# Patient Record
Sex: Male | Born: 1965 | Race: White | Hispanic: No | Marital: Single | Smoking: Current some day smoker
Health system: Southern US, Community
[De-identification: ages and names within clinical notes are randomized; demographics above are authoritative.]

---

## 1999-07-09 ENCOUNTER — Ambulatory Visit (HOSPITAL_COMMUNITY): Admission: RE | Admit: 1999-07-09 | Discharge: 1999-07-09 | Payer: Self-pay | Admitting: Gastroenterology

## 2003-07-08 ENCOUNTER — Emergency Department (HOSPITAL_COMMUNITY): Admission: EM | Admit: 2003-07-08 | Discharge: 2003-07-08 | Payer: Self-pay | Admitting: Emergency Medicine

## 2003-10-14 ENCOUNTER — Inpatient Hospital Stay (HOSPITAL_COMMUNITY): Admission: EM | Admit: 2003-10-14 | Discharge: 2003-10-16 | Payer: Self-pay | Admitting: Psychiatry

## 2005-04-24 ENCOUNTER — Emergency Department (HOSPITAL_COMMUNITY): Admission: EM | Admit: 2005-04-24 | Discharge: 2005-04-24 | Payer: Self-pay | Admitting: Emergency Medicine

## 2005-09-28 ENCOUNTER — Emergency Department (HOSPITAL_COMMUNITY): Admission: EM | Admit: 2005-09-28 | Discharge: 2005-09-28 | Payer: Self-pay | Admitting: Emergency Medicine

## 2005-10-16 ENCOUNTER — Emergency Department (HOSPITAL_COMMUNITY): Admission: EM | Admit: 2005-10-16 | Discharge: 2005-10-16 | Payer: Self-pay | Admitting: Emergency Medicine

## 2005-11-11 ENCOUNTER — Ambulatory Visit: Payer: Self-pay | Admitting: Psychiatry

## 2005-11-11 ENCOUNTER — Encounter: Payer: Self-pay | Admitting: *Deleted

## 2005-11-11 ENCOUNTER — Inpatient Hospital Stay (HOSPITAL_COMMUNITY): Admission: EM | Admit: 2005-11-11 | Discharge: 2005-11-15 | Payer: Self-pay | Admitting: Psychiatry

## 2005-11-22 ENCOUNTER — Emergency Department (HOSPITAL_COMMUNITY): Admission: EM | Admit: 2005-11-22 | Discharge: 2005-11-22 | Payer: Self-pay | Admitting: *Deleted

## 2007-05-15 ENCOUNTER — Emergency Department (HOSPITAL_COMMUNITY): Admission: EM | Admit: 2007-05-15 | Discharge: 2007-05-15 | Payer: Self-pay | Admitting: Emergency Medicine

## 2008-06-12 ENCOUNTER — Emergency Department (HOSPITAL_COMMUNITY): Admission: EM | Admit: 2008-06-12 | Discharge: 2008-06-12 | Payer: Self-pay | Admitting: Emergency Medicine

## 2010-08-16 NOTE — Procedures (Signed)
. [facility]  Patient:    Dennis Rojas, Dennis Rojas                             MRN: [id number] Proc. Date: 07/09/99 Adm. Date:  [id number] Attending:  Starr Sinclair CC:         Corwin Levins, M.D.                           Procedure Report  PROCEDURE:  Esophagogastroduodenoscopy.  ENDOSCOPIST:  Venita Lick. Pleas Koch., M.D.  REFERRING PHYSICIAN:  Corwin Levins, M.D.  INDICATIONS:  A 45 year old white male with a long history of GERD and a hiatal hernia.  He has epigastric pain as well.  His reflux symptoms have not responded well to a trial of Prilosec 20 mg b.i.d.  In addition, he frequently takes [facility] [employer] and [employer].  I have asked him to discontinue these medications, nd to stick closely to antireflux measures.  He has not been able to follow these recommendations very well, and has ongoing symptoms.  PHYSICAL EXAMINATION:  Chest clear to auscultation.  Cardiac regular rate and rhythm without murmurs.  Neurologic alert and oriented x 3.  ANESTHESIA:  Fentanyl 75 mcg IV, Versed 7.5 mg IV, and pharyngeal Cetacaine spray.  MONITORING:  Automated blood pressure monitor, pulse oximeter, and cardiac monitoring.  Low flow oxygen was given by nasal cannula throughout the procedure. The procedure was well-tolerated with no immediate complications.  DESCRIPTION OF PROCEDURE:  After the nature of the procedure was discussed with the patient including the discussion of its risks, benefits, and alternatives, he consented to proceed.  He was then comfortably sedated in the left lateral decubitus position.  The Olympus video endoscope was inserted into the posterior pharynx and esophagus intubated under direct visualization.  The upper and middle third of the esophagus had a normal endoscopic appearance.  In the distal esophagus, there was three to four linear erosions, the longest measuring approximately 1 cm.  Video photographs were obtained.   The Z-line measured at 40 cm from the incisors.  A small sliding hiatal hernia was noted in the gastric cardia on forward and retroflexed view.  Examination of the gastric body was normal. n retroflexed view of the angularis, lesser curvature, and fundus were normal. The gastric antrum, gastric pylorus, duodenal bulb, and descending duodenum were all unremarkable.  Video photographs were obtained.  The stomach was decompressed and the endoscope was withdrawn from the patient.  IMPRESSION: 1. Distal erosive esophagitis. 2. Small sliding hiatal hernia.  RECOMMENDATIONS: 1. Strict antireflux measures.  I again stressed this to the patient.  He needs to    minimize or discontinue alcohol usage.  He needs to discontinue cigarette usage    and follow the dietary recommendations closely. 2. Begin Nexium 40 mg p.o. q.a.m. 3. Return office visit with Corwin Levins, M.D. in eight weeks.  I will see back on    referral. DD:  07/09/99 TD:  07/10/99 Job: [id number]

## 2010-08-16 NOTE — Discharge Summary (Signed)
Dennis Rojas, AMORIN NO.:  [id number]   MEDICAL RECORD NO.:  [id number]                   PATIENT TYPE:  IPS   LOCATION:  0508                                 FACILITY:  [facility]   PHYSICIAN:  Geoffery Lyons, M.D.                   DATE OF BIRTH:  1966-03-05   DATE OF ADMISSION:  10/14/2003  DATE OF DISCHARGE:  10/16/2003                                 DISCHARGE SUMMARY   CHIEF COMPLAINT AND PRESENT ILLNESS:  This was the first admission to [facility] for this 45 year old single white male involuntarily  committed.  Presented to the [facility] drunk, had a rope on his back pack  and complained of suicidal tendencies.  Admits prior attempts to hang  himself.  Alcohol for 20+ years.  Alcohol level in the emergency room was  216.  Has been on a 4-5 day binge.  Frequently uses in a binge pattern.  Depressed and lonely.  Mother died one year prior to this admission of  melanoma.  No family.  Homeless.  Thrown out of [employer].  Positive  auditory hallucinations when intoxicated.  Belligerent with the police when  intoxicated.   PAST PSYCHIATRIC HISTORY:  First time inpatient.  Endorsed depression and  polysubstance abuse.   ALCOHOL/DRUG HISTORY:  Longest time sober not clear.  Ongoing use of  alcohol.  Last drink one-fifth of tequila the day before.   PAST MEDICAL HISTORY:  Bleeding ulcers.   MEDICATIONS:  Was given Xanax and Zantac.  Cannot take it.  Cannot afford  it.   PHYSICAL EXAMINATION:  Mild abdominal tenderness.   LABORATORY DATA:  SGOT 210, SGPT 65.  Total bilirubin 0.7.  Albumin 2.7.  TSH 0.473.   MENTAL STATUS EXAM:  Fully alert, passively cooperative male.  Polite.  Cynical at times.  Speech not as spontaneous.  Some psychomotor retardation.  Mood depressed.  Affect depressed.  Thought processes logical and coherent.  No evidence of psychosis.  No delusional ideas.  Passive suicidal ideation  with no plan.  No  homicidal ideation.  Feeling worthless and useless.  Cognition well-preserved.  No hallucinations.   ADMISSION DIAGNOSES:   AXIS I:  1. Alcohol dependence.  2. Rule out polysubstance abuse.  3. Depressive disorder not otherwise specified.   AXIS II:  No diagnosis.   AXIS III:  Ulcers.   AXIS IV:  Moderate.   AXIS V:  Global Assessment of Functioning upon 26; highest Global Assessment  of Functioning in the last year 55-60.   HOSPITAL COURSE:  He was admitted and started in individual and group  psychotherapy.  He was detoxified with Librium.  Was given Protonix 40 mg  twice a day.  He endorsed recurrent use of alcohol but has been working, has  been staying in the shelter.  Endorsed chronic depression, never treated for  depression.  Claimed sad, also depression and affect when he relapses but  wanting to pursue treatment.  Detox went uneventfully.  He was given options  for places to stay.  He contacted [facility] and he was going to be  interviewed.  On October 16, 2003, he felt he was ready to go.  He was going to  be interviewed in the halfway house and knows it was a high chance that they  were going to accept him.  There was no evidence of acute withdrawal.  Had  tolerated the Wellbutrin well.  Endorsed no suicidal or homicidal ideation.  No evidence of hallucinations.  He was motivated to pursue further  outpatient treatment and work on long-term abstinence for what he was  discharged to outpatient follow-up.   DISCHARGE DIAGNOSES:   AXIS I:  1. Alcohol dependence.  2. Depressive disorder not otherwise specified.   AXIS II:  No diagnosis.   AXIS III:  Ulcers.   AXIS IV:  Moderate.   AXIS V:  Global Assessment of Functioning upon discharge 50-55.   DISCHARGE MEDICATIONS:  1. Librium taper 25 mg, 1 twice a day on October 16, 2003 and 1 in the morning     on October 17, 2003; then discontinue.  2. Protonix 40 mg twice a day.  3. Wellbutrin XL 150 mg daily with plan to  increase to 300 mg.   FOLLOW UP:  [facility], Dr. Lang Snow.                                               Geoffery Lyons, M.D.    IL/MEDQ  D:  11/08/2003  T:  11/08/2003  Job:  [id number]

## 2010-08-16 NOTE — Discharge Summary (Signed)
[name], [name] NO.:  [id number]   MEDICAL RECORD NO.:  [id number]          PATIENT TYPE:  IPS   LOCATION:  0500                          FACILITY:  [facility]   PHYSICIAN:  Geoffery Lyons, M.D.      DATE OF BIRTH:  03-22-1966   DATE OF ADMISSION:  11/11/2005  DATE OF DISCHARGE:  11/15/2005                                 DISCHARGE SUMMARY   CHIEF COMPLAINT AND PRESENT ILLNESS:  This was the first admission to [facility] for this 45 year old white male voluntarily admitted.  Presented to the emergency department, experiencing one day of history of  formication and visual hallucinations.  Had been drinking daily for a week,  6-12 beers preceded by binging on Xanax three weeks prior to this admission.  Stressed by his homelessness and joblessness.  Has had suicidal thoughts  secondary to depression.   PAST PSYCHIATRIC HISTORY:  First time at [facility].  Previous  admission to [facility]9 days three months ago after binging on Xanax 2  mg with alcohol.   ALCOHOL/DRUG HISTORY:  As already stated, persistent use of alcohol,  escalating use.  Also abuse of benzodiazepines.   MEDICAL HISTORY:  Headaches.   MEDICATIONS:  No active medications.   PHYSICAL EXAMINATION:  Performed and failed to show any acute findings.   LABORATORY DATA:  Not available in the chart.   MENTAL STATUS EXAM:  Fully alert, cooperative male with good eye contact.  Appropriately groomed and dressed.  Sense of humor.  Candid about his  substance use.  Speech normal in rate, tempo and production.  Mood anxious.  Affect anxiety.  Thought processes logical, coherent and relevant.  No  active delusions.  No active suicidal or homicidal ideation.  No  hallucinations.  Cognition was well-preserved.   ADMISSION DIAGNOSES:  AXIS I:  Alcohol dependence.  Benzodiazepine abuse.  Rule out substance-induced depression versus depressive disorder not  otherwise specified.  AXIS II:  No diagnosis.  AXIS III:  Leukocytosis.  AXIS IV:  Moderate.  AXIS V:  GAF upon admission 30; highest GAF in the last year 70.   HOSPITAL COURSE:  He was admitted.  He was started in individual and group  psychotherapy.  He was detoxified with Librium.  He was given trazodone for  sleep.  He was eventually started on Wellbutrin.  He was able to start  opening up.  Endorsed a long history of events that had influenced him  getting to this point.  He was staying in a tent in the woods.  Homeless,  jobless, a sense of hopelessness, helplessness, feeling that things were not  going to get any better.  As we detoxed, we continued to work on his mood,  coping skills, relapse prevention.  Endorsed a long history of no energy, no  motivation, feeling overwhelmed.  He was willing to try Wellbutrin.  In  terms of placement, he was going to look at the possibility of halfway  house.  Sleep was an issue but we worked with trazodone.  Endorsed he was  committed to abstinence.  Continued to work in individual and group therapy.  By November 15, 2005, he was in full contact with reality.  There were no  active suicidal or homicidal ideation.  No hallucinations.  No delusions.  Endorsed no ideas of wanting to hurt himself.  Ready to commit to long-term  sobriety.  He was going to go into an [employer].   DISCHARGE DIAGNOSES:  AXIS I:  Alcohol dependence.  Benzodiazepine abuse.  Depressive disorder not otherwise specified.  AXIS II:  No diagnosis.  AXIS III:  No diagnosis.  AXIS IV:  Moderate.  AXIS V:  GAF upon discharge 50-55.   DISCHARGE MEDICATIONS:  1. Pepcid 20 mg per day.  2. Wellbutrin XL 150 mg per day x4 more days; then Wellbutrin XL 300 mg      per day.  3. Trazodone 100 mg at bedtime for sleep.   FOLLOWUP:  Discharged to [facility].      Geoffery Lyons, M.D.  Electronically Signed     IL/MEDQ  D:  12/02/2005  T:  12/02/2005  Job:  [id number]

## 2010-12-20 LAB — COMPREHENSIVE METABOLIC PANEL
ALT: 19
AST: 23
Alkaline Phosphatase: 65
BUN: 10
Chloride: 105
GFR calc Af Amer: >60
GFR calc non Af Amer: >60
Glucose, Bld: 129 — ABNORMAL HIGH
Total Bilirubin: 0.7

## 2010-12-20 LAB — URINALYSIS, ROUTINE W REFLEX MICROSCOPIC
Bilirubin Urine: NEGATIVE
Glucose, UA: NEGATIVE
Ketones, ur: NEGATIVE
Leukocytes, UA: NEGATIVE
Nitrite: NEGATIVE
Protein, ur: 30 — AB
Specific Gravity, Urine: 1.018
Urobilinogen, UA: 0.2
pH: 5

## 2010-12-20 LAB — RAPID URINE DRUG SCREEN, HOSP PERFORMED
Amphetamines: NOT DETECTED
Barbiturates: NOT DETECTED
Benzodiazepines: NOT DETECTED
Cocaine: NOT DETECTED
Opiates: NOT DETECTED
Tetrahydrocannabinol: NOT DETECTED

## 2010-12-20 LAB — DIFFERENTIAL
Basophils Absolute: 0.1
Basophils Relative: 1
Eosinophils Absolute: 0.1
Eosinophils Relative: 1
Lymphocytes Relative: 26
Lymphs Abs: 2.6
Monocytes Absolute: 0.5
Monocytes Relative: 6
Neutro Abs: 6.7
Neutrophils Relative %: 67

## 2010-12-20 LAB — LIPASE, BLOOD: Lipase: 31

## 2010-12-20 LAB — CBC
HCT: 40.4
Hemoglobin: 14.1
MCHC: 34.8
MCV: 88.8
Platelets: 474 — ABNORMAL HIGH
RBC: 4.55
RDW: 15
WBC: 10

## 2010-12-20 LAB — COMPREHENSIVE METABOLIC PANEL WITH GFR
Albumin: 4
CO2: 27
Calcium: 8.7
Creatinine, Ser: 1.1
Potassium: 4.1
Sodium: 143
Total Protein: 6.7

## 2010-12-20 LAB — ETHANOL
Alcohol, Ethyl (B): 178 — ABNORMAL HIGH
Alcohol, Ethyl (B): 254 — ABNORMAL HIGH
Alcohol, Ethyl (B): 316 — ABNORMAL HIGH

## 2010-12-20 LAB — TRICYCLICS SCREEN, URINE: TCA Scrn: NOT DETECTED

## 2010-12-20 LAB — SALICYLATE LEVEL: Salicylate Lvl: <4

## 2010-12-20 LAB — URINE MICROSCOPIC-ADD ON

## 2010-12-20 LAB — ACETAMINOPHEN LEVEL: Acetaminophen (Tylenol), Serum: <10 — ABNORMAL LOW

## 2012-11-19 ENCOUNTER — Emergency Department (HOSPITAL_COMMUNITY)
Admission: EM | Admit: 2012-11-19 | Discharge: 2012-11-19 | Disposition: A | Discharge status: Home / Self Care | Payer: Self-pay | Attending: Emergency Medicine | Admitting: Emergency Medicine

## 2012-11-19 ENCOUNTER — Encounter (HOSPITAL_COMMUNITY): Payer: Self-pay | Admitting: *Deleted

## 2012-11-19 DIAGNOSIS — Y939 Activity, unspecified: Secondary | ICD-10-CM | POA: Insufficient documentation

## 2012-11-19 DIAGNOSIS — S025XXA Fracture of tooth (traumatic), initial encounter for closed fracture: Secondary | ICD-10-CM

## 2012-11-19 DIAGNOSIS — L02419 Cutaneous abscess of limb, unspecified: Secondary | ICD-10-CM | POA: Insufficient documentation

## 2012-11-19 DIAGNOSIS — X58XXXA Exposure to other specified factors, initial encounter: Secondary | ICD-10-CM | POA: Insufficient documentation

## 2012-11-19 DIAGNOSIS — Y929 Unspecified place or not applicable: Secondary | ICD-10-CM | POA: Insufficient documentation

## 2012-11-19 DIAGNOSIS — L03115 Cellulitis of right lower limb: Secondary | ICD-10-CM

## 2012-11-19 MED ORDER — OXYCODONE-ACETAMINOPHEN 5-325 MG PO TABS
2.0000 | ORAL_TABLET | Freq: Once | ORAL | Status: AC
  Administered 2012-11-19: 2 via ORAL
  Filled 2012-11-19: qty 2

## 2012-11-19 MED ORDER — CLINDAMYCIN HCL 150 MG PO CAPS: 150.0000 mg | ORAL_CAPSULE | Freq: Four times a day (QID) | ORAL | Status: DC

## 2012-11-19 MED ORDER — OXYCODONE-ACETAMINOPHEN 5-325 MG PO TABS: 2.0000 | ORAL_TABLET | ORAL | Status: DC | PRN

## 2012-11-19 NOTE — ED Notes (Signed)
Pt reports abscess on (R) hip that has been there for 2 months.  Reports dental pain on his right lower jaw.  Reports tylenol at home PTA.

## 2012-11-19 NOTE — ED Provider Notes (Signed)
  CSN: [id number]     Arrival date & time 11/19/12  1039 History     First MD Initiated Contact with Patient 11/19/12 1102     Chief Complaint  Patient presents with  . Abscess  . Dental Pain   (Consider location/radiation/quality/duration/timing/severity/associated sxs/prior Treatment) Patient is a 47 y.o. male presenting with abscess and tooth pain. The history is provided by the patient.  Abscess Dental Pain  patient presents complaining of right lower jaw pain, and fractured tooth x2 weeks as well as increased right hip swelling and erythema x3 weeks. No drainage from the area or increased redness but now extends over to his right groin. No reported chills at home. No vomiting or diarrhea. Symptoms have been persistent and nothing makes it worse. Has been using warm compresses without relief. History of multiple episodes in the past.  History reviewed. No pertinent past medical history. History reviewed. No pertinent past surgical history. History reviewed. No pertinent family history. History  Substance Use Topics  . Smoking status: Never Smoker   . Smokeless tobacco: Not on file  . Alcohol Use: No    Review of Systems  All other systems reviewed and are negative.    Allergies  Review of patient's allergies indicates no known allergies.  Home Medications   Current Outpatient Rx  Name  Route  Sig  Dispense  Refill  . acetaminophen (TYLENOL) 500 MG tablet   Oral   Take 1,500-2,000 mg by mouth every 6 (six) hours as needed for pain.          BP 146/87  Pulse 101  Temp(Src) 99.2 F (37.3 C) (Oral)  Resp 18  SpO2 99% Physical Exam  Nursing note and vitals reviewed. Constitutional: He is oriented to person, place, and time. He appears well-developed and well-nourished.  Non-toxic appearance. No distress.  HENT:  Head: Normocephalic and atraumatic.  Mouth/Throat:    Eyes: Conjunctivae, EOM and lids are normal. Pupils are equal, round, and reactive to light.    Neck: Normal range of motion. Neck supple. No tracheal deviation present. No mass present.  Cardiovascular: Normal rate, regular rhythm and normal heart sounds.  Exam reveals no gallop.   No murmur heard. Pulmonary/Chest: Effort normal and breath sounds normal. No stridor. No respiratory distress. He has no decreased breath sounds. He has no wheezes. He has no rhonchi. He has no rales.  Abdominal: Soft. Normal appearance and bowel sounds are normal. He exhibits no distension. There is no tenderness. There is no rebound and no CVA tenderness.  Musculoskeletal: Normal range of motion. He exhibits no edema and no tenderness.       Legs: Neurological: He is alert and oriented to person, place, and time. He has normal strength. No cranial nerve deficit or sensory deficit. GCS eye subscore is 4. GCS verbal subscore is 5. GCS motor subscore is 6.  Skin: Skin is warm and dry. No abrasion and no rash noted.  Psychiatric: He has a normal mood and affect. His speech is normal and behavior is normal.    ED Course   Procedures (including critical care time)  Labs Reviewed - No data to display No results found. No diagnosis found.  MDM  Patient offered excision of his right hip abscess which she has deferred this time. Will place patient on antibiotics and pain medication and have him return in 24 hours for recheck  Toy Baker, MD 11/19/12 1205

## 2016-08-05 ENCOUNTER — Emergency Department (HOSPITAL_COMMUNITY)
Admission: EM | Admit: 2016-08-05 | Discharge: 2016-08-05 | Disposition: A | Discharge status: Home / Self Care | Payer: Self-pay | Attending: Emergency Medicine | Admitting: Emergency Medicine

## 2016-08-05 ENCOUNTER — Encounter (HOSPITAL_COMMUNITY): Payer: Self-pay | Admitting: Emergency Medicine

## 2016-08-05 DIAGNOSIS — F17200 Nicotine dependence, unspecified, uncomplicated: Secondary | ICD-10-CM | POA: Insufficient documentation

## 2016-08-05 DIAGNOSIS — N481 Balanitis: Secondary | ICD-10-CM | POA: Insufficient documentation

## 2016-08-05 LAB — CBG MONITORING, ED: Glucose-Capillary: 102 mg/dL — ABNORMAL HIGH (ref 65–99)

## 2016-08-05 MED ORDER — DEXAMETHASONE SODIUM PHOSPHATE 10 MG/ML IJ SOLN
10.0000 mg | Freq: Once | INTRAMUSCULAR | Status: AC
  Administered 2016-08-05: 10 mg via INTRAMUSCULAR
  Filled 2016-08-05: qty 1

## 2016-08-05 MED ORDER — CLOTRIMAZOLE 1 % EX OINT: 2.0000 g | TOPICAL_OINTMENT | Freq: Two times a day (BID) | CUTANEOUS | 1 refills | Status: AC

## 2016-08-05 NOTE — ED Provider Notes (Signed)
[facility]-EMERGENCY DEPT Provider Note   CSN: [id number] Arrival date & time: 08/05/16  1159  By signing my name below, I, Dennis Rojas, attest that this documentation has been prepared under the direction and in the presence of [name], MD. Electronically Signed: Sonum Rojas, Neurosurgeon. 08/05/16. 1:13 PM.  History   Chief Complaint Chief Complaint  Patient presents with  . Rash  . penile rash    The history is provided by the patient. No language interpreter was used.    HPI Comments: Dennis Rojas is a 51 y.o. male who presents to the Emergency Department complaining of 1 month of persistent rash to the penis. He notes last episode of sexual intercourse was 1 month ago; states he used protection. He applied cortisone cream which worsened his symptoms. He denies associated urinary frequency, dysuria. He denies history of DM.   History reviewed. No pertinent past medical history.  There are no active problems to display for this patient.   History reviewed. No pertinent surgical history.     Home Medications    Prior to Admission medications   Medication Sig Start Date End Date Taking? Authorizing Provider  acetaminophen (TYLENOL) 500 MG tablet Take 1,500-2,000 mg by mouth every 6 (six) hours as needed for pain.    [provider]  clindamycin (CLEOCIN) 150 MG capsule Take 1 capsule (150 mg total) by mouth every 6 (six) hours. 11/19/12   Lorre Nick, MD  oxyCODONE-acetaminophen (PERCOCET/ROXICET) 5-325 MG per tablet Take 2 tablets by mouth every 4 (four) hours as needed for pain. 11/19/12   Lorre Nick, MD    Family History No family history on file.  Social History Social History  Substance Use Topics  . Smoking status: Current Some Day Smoker  . Smokeless tobacco: Never Used  . Alcohol use No     Allergies   Patient has no known allergies.   Review of Systems Review of Systems  Genitourinary: Positive for penile pain. Negative for dysuria and  frequency.  Skin: Positive for rash.     Physical Exam Updated Vital Signs BP (!) 148/102 (BP Location: Right Arm)   Pulse 94   Temp 97.9 F (36.6 C) (Oral)   Resp 18   Ht 5\' 7"  (1.702 m)   Wt 165 lb (74.8 kg)   SpO2 93%   BMI 25.84 kg/m   Physical Exam  Constitutional: He is oriented to person, place, and time. He appears well-developed and well-nourished.  HENT:  Head: Normocephalic.  Eyes: EOM are normal.  Neck: Normal range of motion.  Pulmonary/Chest: Effort normal.  Abdominal: He exhibits no distension.  Genitourinary: Testes normal. Right testis shows no tenderness. Left testis shows no tenderness.  Genitourinary Comments: Around foreskin and distal aspect of penis the skin has an excoriated appearance. No discreet lesions. Some areas of scaling. Testicles non tender. No perineal rash. No adenopathy.   Musculoskeletal: Normal range of motion.  Lymphadenopathy: No inguinal adenopathy noted on the right or left side.  Neurological: He is alert and oriented to person, place, and time.  Psychiatric: He has a normal mood and affect.  Nursing note and vitals reviewed.    ED Treatments / Results  DIAGNOSTIC STUDIES: Oxygen Saturation is 93% on RA, adequate by my interpretation.    COORDINATION OF CARE: 1:10 PM Discussed treatment plan with pt at bedside and pt agreed to plan.   Labs (all labs ordered are listed, but only abnormal results are displayed) Labs Reviewed  CBG MONITORING, ED -  Abnormal; Notable for the following:       Result Value   Glucose-Capillary 102 (*)    All other components within normal limits  RPR  GC/CHLAMYDIA PROBE AMP ([place]    EKG  EKG Interpretation None       Radiology No results found.  Procedures Procedures (including critical care time)  Medications Ordered in ED Medications - No data to display   Initial Impression / Assessment and Plan / ED Course  I have reviewed the triage vital signs and the  nursing notes.  Pertinent labs & imaging results that were available during my care of the patient were reviewed by me and considered in my medical decision making (see chart for details).    50yM with penile rash which I suspect is balanitis. Hygiene discussed. Topical antifungal. Non-fasting glucose ok.   Final Clinical Impressions(s) / ED Diagnoses   Final diagnoses:  Balanitis    New Prescriptions New Prescriptions   No medications on file   I personally preformed the services scribed in my presence. The recorded information has been reviewed is accurate. Raeford RazorStephen Jarita Raval, MD.    Raeford RazorKohut, Willem Klingensmith, MD 08/11/16 [phone]

## 2016-08-05 NOTE — ED Triage Notes (Signed)
States has a rash in groin-- "I think it might be poison ivy"

## 2016-08-05 NOTE — ED Notes (Signed)
Pt c/o itching rash to groin area x 1 month. States "I let it dry out, then it flaked-up, then it came back".

## 2016-08-05 NOTE — ED Notes (Signed)
Pt requesting "a shot". MD notified.

## 2016-08-06 LAB — GC/CHLAMYDIA PROBE AMP (~~LOC~~) NOT AT ARMC
Chlamydia: NEGATIVE
Neisseria Gonorrhea: NEGATIVE

## 2016-08-06 LAB — RPR: RPR Ser Ql: NONREACTIVE

## 2018-09-05 ENCOUNTER — Emergency Department (HOSPITAL_COMMUNITY): Payer: Self-pay

## 2018-09-05 ENCOUNTER — Encounter (HOSPITAL_COMMUNITY): Payer: Self-pay | Admitting: *Deleted

## 2018-09-05 ENCOUNTER — Emergency Department (HOSPITAL_COMMUNITY)
Admission: EM | Admit: 2018-09-05 | Discharge: 2018-09-05 | Disposition: A | Discharge status: Home / Self Care | Payer: Self-pay | Attending: Emergency Medicine | Admitting: Emergency Medicine

## 2018-09-05 ENCOUNTER — Other Ambulatory Visit: Payer: Self-pay

## 2018-09-05 DIAGNOSIS — J189 Pneumonia, unspecified organism: Secondary | ICD-10-CM | POA: Insufficient documentation

## 2018-09-05 DIAGNOSIS — F17200 Nicotine dependence, unspecified, uncomplicated: Secondary | ICD-10-CM | POA: Insufficient documentation

## 2018-09-05 DIAGNOSIS — G8929 Other chronic pain: Secondary | ICD-10-CM | POA: Insufficient documentation

## 2018-09-05 DIAGNOSIS — R1012 Left upper quadrant pain: Secondary | ICD-10-CM | POA: Insufficient documentation

## 2018-09-05 LAB — URINALYSIS, ROUTINE W REFLEX MICROSCOPIC
Bacteria, UA: NONE SEEN
Bilirubin Urine: NEGATIVE
Glucose, UA: NEGATIVE mg/dL
Hgb urine dipstick: NEGATIVE
Ketones, ur: 80 mg/dL — AB
Leukocytes,Ua: NEGATIVE
Nitrite: NEGATIVE
Protein, ur: 30 mg/dL — AB
Specific Gravity, Urine: 1.029 (ref 1.005–1.030)
pH: 5 (ref 5.0–8.0)

## 2018-09-05 LAB — CBC WITH DIFFERENTIAL/PLATELET
Abs Immature Granulocytes: 0.07 10*3/uL (ref 0.00–0.07)
Basophils Absolute: 0.1 10*3/uL (ref 0.0–0.1)
Basophils Relative: 1 %
Eosinophils Absolute: 0 10*3/uL (ref 0.0–0.5)
Eosinophils Relative: 0 %
HCT: 42.2 % (ref 39.0–52.0)
Hemoglobin: 14.1 g/dL (ref 13.0–17.0)
Immature Granulocytes: 0 %
Lymphocytes Relative: 8 %
Lymphs Abs: 1.2 10*3/uL (ref 0.7–4.0)
MCH: 31.4 pg (ref 26.0–34.0)
MCHC: 33.4 g/dL (ref 30.0–36.0)
MCV: 94 fL (ref 80.0–100.0)
Monocytes Absolute: 1.3 10*3/uL — ABNORMAL HIGH (ref 0.1–1.0)
Monocytes Relative: 8 %
Neutro Abs: 13.2 10*3/uL — ABNORMAL HIGH (ref 1.7–7.7)
Neutrophils Relative %: 83 %
Platelets: 723 10*3/uL — ABNORMAL HIGH (ref 150–400)
RBC: 4.49 MIL/uL (ref 4.22–5.81)
RDW: 12.9 % (ref 11.5–15.5)
WBC: 15.9 10*3/uL — ABNORMAL HIGH (ref 4.0–10.5)
nRBC: 0 % (ref 0.0–0.2)

## 2018-09-05 LAB — COMPREHENSIVE METABOLIC PANEL
ALT: 17 U/L (ref 0–44)
AST: 16 U/L (ref 15–41)
Albumin: 3.7 g/dL (ref 3.5–5.0)
Alkaline Phosphatase: 69 U/L (ref 38–126)
Anion gap: 10 (ref 5–15)
BUN: 15 mg/dL (ref 6–20)
CO2: 24 mmol/L (ref 22–32)
Calcium: 9.6 mg/dL (ref 8.9–10.3)
Chloride: 101 mmol/L (ref 98–111)
Creatinine, Ser: 0.95 mg/dL (ref 0.61–1.24)
GFR calc Af Amer: >60 mL/min (ref >60)
GFR calc non Af Amer: >60 mL/min (ref >60)
Glucose, Bld: 100 mg/dL — ABNORMAL HIGH (ref 70–99)
Potassium: 4.7 mmol/L (ref 3.5–5.1)
Sodium: 135 mmol/L (ref 135–145)
Total Bilirubin: 0.6 mg/dL (ref 0.3–1.2)
Total Protein: 7.8 g/dL (ref 6.5–8.1)

## 2018-09-05 LAB — ACETAMINOPHEN LEVEL: Acetaminophen (Tylenol), Serum: <10 ug/mL — ABNORMAL LOW (ref 10–30)

## 2018-09-05 LAB — LIPASE, BLOOD: Lipase: 29 U/L (ref 11–51)

## 2018-09-05 LAB — D-DIMER, QUANTITATIVE: D-Dimer, Quant: 1.23 ug/mL-FEU — ABNORMAL HIGH (ref 0.00–0.50)

## 2018-09-05 MED ORDER — DOXYCYCLINE HYCLATE 100 MG PO TABS
100.0000 mg | ORAL_TABLET | Freq: Once | ORAL | Status: AC
  Administered 2018-09-05: 11:00:00 100 mg via ORAL
  Filled 2018-09-05: qty 1

## 2018-09-05 MED ORDER — ALBUTEROL SULFATE HFA 108 (90 BASE) MCG/ACT IN AERS
2.0000 | INHALATION_SPRAY | Freq: Once | RESPIRATORY_TRACT | Status: AC
  Administered 2018-09-05: 11:00:00 2 via RESPIRATORY_TRACT
  Filled 2018-09-05: qty 6.7

## 2018-09-05 MED ORDER — MORPHINE SULFATE (PF) 4 MG/ML IV SOLN
4.0000 mg | Freq: Once | INTRAVENOUS | Status: AC
  Administered 2018-09-05: 11:00:00 4 mg via INTRAVENOUS
  Filled 2018-09-05: qty 1

## 2018-09-05 MED ORDER — SUCRALFATE 1 G PO TABS
1.0000 g | ORAL_TABLET | Freq: Once | ORAL | Status: AC
  Administered 2018-09-05: 1 g via ORAL
  Filled 2018-09-05: qty 1

## 2018-09-05 MED ORDER — MORPHINE SULFATE (PF) 4 MG/ML IV SOLN
4.0000 mg | Freq: Once | INTRAVENOUS | Status: AC
  Administered 2018-09-05: 4 mg via INTRAVENOUS
  Filled 2018-09-05: qty 1

## 2018-09-05 MED ORDER — DOXYCYCLINE HYCLATE 100 MG PO CAPS: 100.0000 mg | ORAL_CAPSULE | Freq: Two times a day (BID) | ORAL | 0 refills | Status: AC

## 2018-09-05 MED ORDER — FAMOTIDINE IN NACL 20-0.9 MG/50ML-% IV SOLN
20.0000 mg | Freq: Once | INTRAVENOUS | Status: AC
  Administered 2018-09-05: 20 mg via INTRAVENOUS
  Filled 2018-09-05: qty 50

## 2018-09-05 MED ORDER — PANTOPRAZOLE SODIUM 40 MG IV SOLR
40.0000 mg | Freq: Once | INTRAVENOUS | Status: AC
  Administered 2018-09-05: 40 mg via INTRAVENOUS
  Filled 2018-09-05: qty 40

## 2018-09-05 MED ORDER — SODIUM CHLORIDE 0.9 % IV BOLUS
1000.0000 mL | Freq: Once | INTRAVENOUS | Status: AC
  Administered 2018-09-05: 1000 mL via INTRAVENOUS

## 2018-09-05 MED ORDER — BENZONATATE 100 MG PO CAPS: 100.0000 mg | ORAL_CAPSULE | Freq: Three times a day (TID) | ORAL | 0 refills | Status: AC

## 2018-09-05 MED ORDER — IOHEXOL 350 MG/ML SOLN
100.0000 mL | Freq: Once | INTRAVENOUS | Status: AC | PRN
  Administered 2018-09-05: 10:00:00 100 mL via INTRAVENOUS

## 2018-09-05 NOTE — ED Provider Notes (Signed)
[facility] EMERGENCY DEPARTMENT Provider Note   CSN: [id number] Arrival date & time: 09/05/18  4098    History   Chief Complaint Chief Complaint  Patient presents with   Abdominal Pain    HPI Dennis Rojas is a 53 y.o. male.     HPI   Patient is a 53 year old male who presents the emergency department today for evaluation of abdominal pain.  He states that he has chronic epigastric and left upper quadrant abdominal pain for years however today pain seemed to be more severe.  Pain radiates to the left flank.  He has pain with inspiration as well.  He denies chest pain or hypoxia.  Pain is severe in nature and has been constant since he woke up.  Denies associated nausea, vomiting, diarrhea, constipation, fevers, melena or bloody stools.  No urinary complaints.  States he is still passing flatus.  He does report an associated cough for the last 2 weeks but states it started after he stopped smoking cigars.  States the cough is productive of "black stuff "that he states is from smoking cigars.  States that he takes "a lot of Tylenol ".  States he takes about 3-6 tablets of 500 mg Tylenol daily for his abd pain and generalized chronic body pain.   He denies any recent bilateral lower extremity swelling or calf pain, hemoptysis, surgeries, hospitalizations, extended periods of travel, VTE, cancer.  States he uses THC but no other drugs.  States he drinks alcohol daily.  Yesterday he drank the 40 ounce beers.  He states that every day he drinks about 1-2 40 ounce beers.  History reviewed. No pertinent past medical history.  There are no active problems to display for this patient.   History reviewed. No pertinent surgical history.      Home Medications    Prior to Admission medications   Medication Sig Start Date End Date Taking? Authorizing Provider  diphenhydramine-acetaminophen (TYLENOL PM) 25-500 MG TABS tablet Take 3.5 tablets by mouth at bedtime as needed  (pain/sleep).   Yes [provider]  benzonatate (TESSALON) 100 MG capsule Take 1 capsule (100 mg total) by mouth every 8 (eight) hours for 5 days. 09/05/18 09/10/18  Janelly Switalski S, PA-C  doxycycline (VIBRAMYCIN) 100 MG capsule Take 1 capsule (100 mg total) by mouth 2 (two) times daily for 7 days. 09/05/18 09/12/18  Fruma Africa S, PA-C  oxyCODONE-acetaminophen (PERCOCET/ROXICET) 5-325 MG per tablet Take 2 tablets by mouth every 4 (four) hours as needed for pain. Patient not taking: Reported on 09/05/2018 11/19/12   Lorre Nick, MD    Family History History reviewed. No pertinent family history.  Social History Social History   Tobacco Use   Smoking status: Current Some Day Smoker   Smokeless tobacco: Never Used  Substance Use Topics   Alcohol use: No    Comment: Daily  3 40"s on SAT 09-04-18   Drug use: No     Allergies   Patient has no known allergies.   Review of Systems Review of Systems  Constitutional: Negative for fever.  HENT: Negative for ear pain and sore throat.   Eyes: Negative for visual disturbance.  Respiratory: Positive for cough. Negative for shortness of breath.   Cardiovascular: Negative for chest pain.  Gastrointestinal: Positive for abdominal pain. Negative for constipation, diarrhea, nausea and vomiting.  Genitourinary: Positive for flank pain. Negative for dysuria, hematuria and urgency.  Musculoskeletal: Negative for back pain.  Skin: Negative for rash.  Neurological:  Negative for headaches.  All other systems reviewed and are negative.    Physical Exam Updated Vital Signs BP (!) 154/96    Pulse 94    Temp 98.5 F (36.9 C) (Oral)    Resp (!) 21    Ht 5\' 6"  (1.676 m)    Wt 70.3 kg    SpO2 97%    BMI 25.02 kg/m   Physical Exam Vitals signs and nursing note reviewed.  Constitutional:      Appearance: He is well-developed. He is not ill-appearing.  HENT:     Head: Normocephalic and atraumatic.  Eyes:     Conjunctiva/sclera:  Conjunctivae normal.  Neck:     Musculoskeletal: Neck supple.  Cardiovascular:     Rate and Rhythm: Normal rate and regular rhythm.     Heart sounds: No murmur.     Comments: Intermittently marginally tachycardic Pulmonary:     Effort: Pulmonary effort is normal. No respiratory distress.     Breath sounds: Normal breath sounds.  Abdominal:     General: Abdomen is flat. Bowel sounds are normal.     Palpations: Abdomen is soft.     Tenderness: There is abdominal tenderness in the right upper quadrant, epigastric area and left upper quadrant. There is no right CVA tenderness or left CVA tenderness.  Skin:    General: Skin is warm and dry.  Neurological:     Mental Status: He is alert.      ED Treatments / Results  Labs (all labs ordered are listed, but only abnormal results are displayed) Labs Reviewed  CBC WITH DIFFERENTIAL/PLATELET - Abnormal; Notable for the following components:      Result Value   WBC 15.9 (*)    Platelets 723 (*)    Neutro Abs 13.2 (*)    Monocytes Absolute 1.3 (*)    All other components within normal limits  COMPREHENSIVE METABOLIC PANEL - Abnormal; Notable for the following components:   Glucose, Bld 100 (*)    All other components within normal limits  URINALYSIS, ROUTINE W REFLEX MICROSCOPIC - Abnormal; Notable for the following components:   Ketones, ur 80 (*)    Protein, ur 30 (*)    All other components within normal limits  D-DIMER, QUANTITATIVE (NOT AT [facility]) - Abnormal; Notable for the following components:   D-Dimer, Quant 1.23 (*)    All other components within normal limits  ACETAMINOPHEN LEVEL - Abnormal; Notable for the following components:   Acetaminophen (Tylenol), Serum <10 (*)    All other components within normal limits  LIPASE, BLOOD    EKG None  Radiology Ct Angio Chest Pe W And/or Wo Contrast  Result Date: 09/05/2018 CLINICAL DATA:  Chest pain and abdominal pain EXAM: CT ANGIOGRAPHY CHEST CT ABDOMEN AND PELVIS WITH  CONTRAST TECHNIQUE: Multidetector CT imaging of the chest was performed using the standard protocol during bolus administration of intravenous contrast. Multiplanar CT image reconstructions and MIPs were obtained to evaluate the vascular anatomy. Multidetector CT imaging of the abdomen and pelvis was performed using the standard protocol during bolus administration of intravenous contrast. CONTRAST:  100mL OMNIPAQUE IOHEXOL 350 MG/ML SOLN COMPARISON:  Chest radiograph September 05, 2018 FINDINGS: CTA CHEST FINDINGS Cardiovascular: There is no demonstrable pulmonary embolus. There is no thoracic aortic aneurysm or dissection. The visualized great vessels appear unremarkable. The right innominate and left common carotid arteries arise as a common trunk, an anatomic variant. There is no pericardial effusion or pericardial thickening evident. Mediastinum/Nodes: Visualized thyroid appears  normal. There are multiple subcentimeter mediastinal lymph nodes. There are subcarinal lymph nodes, largest measuring 1.5 x 1.4 cm. There is a small hiatal hernia. Lungs/Pleura: There is airspace consolidation in portions of the anterior and lateral segments of the left lower lobe. There is patchy infiltrate surrounding this area of more focused consolidation. This lesion measures 5.2 4.7 x 3.5 cm. On delayed images of the upper abdomen, there is a an area of apparent loculated fluid within this consolidation measuring 1.7 x 1.6 cm. Elsewhere, there is atelectatic change in the right base. No evident pleural effusion or pleural thickening. Musculoskeletal: There are no blastic or lytic bone lesions. There is an old healed fracture of the posterior right eleventh rib. There is degenerative change in each shoulder. No chest wall lesions are demonstrable. Review of the MIP images confirms the above findings. CT ABDOMEN and PELVIS FINDINGS Hepatobiliary: No focal liver lesions are appreciable. Gallbladder wall is not appreciably thickened. There  is no biliary duct dilatation. Pancreas: There is no evident pancreatic mass or inflammatory focus. Spleen: No splenic lesions are evident. Adrenals/Urinary Tract: Adrenals bilaterally appear normal. Kidneys bilaterally show no evident mass or hydronephrosis on either side. There is no evident renal or ureteral calculus on either side. Urinary bladder is midline with wall thickness within normal limits. Stomach/Bowel: There is no appreciable bowel wall or mesenteric thickening. There is no evident bowel obstruction. The terminal ileum region appears normal. There is no free air or portal venous air. Vascular/Lymphatic: There is no abdominal aortic aneurysm. The aorta is somewhat tortuous. There is mild atherosclerotic plaque in the aorta and common iliac arteries. No adenopathy evident in the abdomen or pelvis. Reproductive: There are occasional prostatic calculi. Prostate and seminal vesicles are normal in size and contour. There is no evident pelvic mass. Other: The appendix appears normal. There is no evident abscess or ascites in the abdomen or pelvis. Musculoskeletal: No blastic or lytic bone lesions evident. No intramuscular or abdominal wall lesions are evident. Review of the MIP images confirms the above findings. IMPRESSION: CT angiogram chest: 1. No demonstrable pulmonary embolus. No thoracic aortic aneurysm or dissection. 2. Airspace consolidation in the anterior segment of the left lower lobe extending into a portion of the lateral segment left lower lobe. This area of consolidation is suspected to represent pneumonia; an underlying mass lesion cannot be excluded in this area. There is mild surrounding more patchy infiltrate in the left lower lobe. There is atelectasis in the right base posteriorly. Lungs elsewhere clear. 3. Subcarinal lymph node prominence which may be either of reactive etiology or potentially neoplastic. 4.  Small hiatal hernia. Comment: Given concern for potential underlying neoplasm  in the area of consolidation in the left lower lobe, a follow-up study in 3-4 weeks after appropriate treatment for pneumonia is felt to be warranted. CT abdomen and pelvis: 1. No evident bowel obstruction. No abscess in the abdomen or pelvis. The appendix appears normal. 2. No evident renal or ureteral calculus. No hydronephrosis. Occasional prostatic calculi noted. Urinary bladder wall thickness normal. 3. Slight aortic and iliac artery atherosclerosis. Electronically Signed   By: Bretta BangWilliam  Woodruff III M.D.   On: 09/05/2018 10:52   Ct Abdomen Pelvis W Contrast  Result Date: 09/05/2018 CLINICAL DATA:  Chest pain and abdominal pain EXAM: CT ANGIOGRAPHY CHEST CT ABDOMEN AND PELVIS WITH CONTRAST TECHNIQUE: Multidetector CT imaging of the chest was performed using the standard protocol during bolus administration of intravenous contrast. Multiplanar CT image reconstructions and MIPs were  obtained to evaluate the vascular anatomy. Multidetector CT imaging of the abdomen and pelvis was performed using the standard protocol during bolus administration of intravenous contrast. CONTRAST:  100mL OMNIPAQUE IOHEXOL 350 MG/ML SOLN COMPARISON:  Chest radiograph September 05, 2018 FINDINGS: CTA CHEST FINDINGS Cardiovascular: There is no demonstrable pulmonary embolus. There is no thoracic aortic aneurysm or dissection. The visualized great vessels appear unremarkable. The right innominate and left common carotid arteries arise as a common trunk, an anatomic variant. There is no pericardial effusion or pericardial thickening evident. Mediastinum/Nodes: Visualized thyroid appears normal. There are multiple subcentimeter mediastinal lymph nodes. There are subcarinal lymph nodes, largest measuring 1.5 x 1.4 cm. There is a small hiatal hernia. Lungs/Pleura: There is airspace consolidation in portions of the anterior and lateral segments of the left lower lobe. There is patchy infiltrate surrounding this area of more focused  consolidation. This lesion measures 5.2 4.7 x 3.5 cm. On delayed images of the upper abdomen, there is a an area of apparent loculated fluid within this consolidation measuring 1.7 x 1.6 cm. Elsewhere, there is atelectatic change in the right base. No evident pleural effusion or pleural thickening. Musculoskeletal: There are no blastic or lytic bone lesions. There is an old healed fracture of the posterior right eleventh rib. There is degenerative change in each shoulder. No chest wall lesions are demonstrable. Review of the MIP images confirms the above findings. CT ABDOMEN and PELVIS FINDINGS Hepatobiliary: No focal liver lesions are appreciable. Gallbladder wall is not appreciably thickened. There is no biliary duct dilatation. Pancreas: There is no evident pancreatic mass or inflammatory focus. Spleen: No splenic lesions are evident. Adrenals/Urinary Tract: Adrenals bilaterally appear normal. Kidneys bilaterally show no evident mass or hydronephrosis on either side. There is no evident renal or ureteral calculus on either side. Urinary bladder is midline with wall thickness within normal limits. Stomach/Bowel: There is no appreciable bowel wall or mesenteric thickening. There is no evident bowel obstruction. The terminal ileum region appears normal. There is no free air or portal venous air. Vascular/Lymphatic: There is no abdominal aortic aneurysm. The aorta is somewhat tortuous. There is mild atherosclerotic plaque in the aorta and common iliac arteries. No adenopathy evident in the abdomen or pelvis. Reproductive: There are occasional prostatic calculi. Prostate and seminal vesicles are normal in size and contour. There is no evident pelvic mass. Other: The appendix appears normal. There is no evident abscess or ascites in the abdomen or pelvis. Musculoskeletal: No blastic or lytic bone lesions evident. No intramuscular or abdominal wall lesions are evident. Review of the MIP images confirms the above  findings. IMPRESSION: CT angiogram chest: 1. No demonstrable pulmonary embolus. No thoracic aortic aneurysm or dissection. 2. Airspace consolidation in the anterior segment of the left lower lobe extending into a portion of the lateral segment left lower lobe. This area of consolidation is suspected to represent pneumonia; an underlying mass lesion cannot be excluded in this area. There is mild surrounding more patchy infiltrate in the left lower lobe. There is atelectasis in the right base posteriorly. Lungs elsewhere clear. 3. Subcarinal lymph node prominence which may be either of reactive etiology or potentially neoplastic. 4.  Small hiatal hernia. Comment: Given concern for potential underlying neoplasm in the area of consolidation in the left lower lobe, a follow-up study in 3-4 weeks after appropriate treatment for pneumonia is felt to be warranted. CT abdomen and pelvis: 1. No evident bowel obstruction. No abscess in the abdomen or pelvis. The appendix appears normal.  2. No evident renal or ureteral calculus. No hydronephrosis. Occasional prostatic calculi noted. Urinary bladder wall thickness normal. 3. Slight aortic and iliac artery atherosclerosis. Electronically Signed   By: Lowella Grip III M.D.   On: 09/05/2018 10:52   Dg Chest Portable 1 View  Result Date: 09/05/2018 CLINICAL DATA:  53 year old with epigastric abdominal pain. EXAM: PORTABLE CHEST 1 VIEW COMPARISON:  Chest radiograph 05/14/2017 FINDINGS: Poorly defined opacity in the left lower chest measuring up to 7.4 cm. This is new from the prior examination. Remainder of the lungs are clear. Ribs in the left lower chest appear to be intact. Negative for pneumothorax. Heart and mediastinum are within normal limits. Trachea is midline. Bowel gas pattern in the upper abdomen is unremarkable. IMPRESSION: Large opacity in the left lower chest. Chest neoplasm cannot be excluded. Pneumonia is also in the differential diagnosis. Recommend a chest  CT with IV contrast to exclude a neoplastic process. Electronically Signed   By: Markus Daft M.D.   On: 09/05/2018 09:29    Procedures Procedures (including critical care time)  Medications Ordered in ED Medications  pantoprazole (PROTONIX) injection 40 mg (40 mg Intravenous Given 09/05/18 0923)  famotidine (PEPCID) IVPB 20 mg premix (0 mg Intravenous Stopped 09/05/18 0952)  sucralfate (CARAFATE) tablet 1 g (1 g Oral Given 09/05/18 0923)  sodium chloride 0.9 % bolus 1,000 mL (0 mLs Intravenous Stopped 09/05/18 1027)  morphine 4 MG/ML injection 4 mg (4 mg Intravenous Given 09/05/18 0949)  iohexol (OMNIPAQUE) 350 MG/ML injection 100 mL (100 mLs Intravenous Contrast Given 09/05/18 1017)  morphine 4 MG/ML injection 4 mg (4 mg Intravenous Given 09/05/18 1129)  doxycycline (VIBRA-TABS) tablet 100 mg (100 mg Oral Given 09/05/18 1128)  albuterol (VENTOLIN HFA) 108 (90 Base) MCG/ACT inhaler 2 puff (2 puffs Inhalation Given 09/05/18 1129)     Initial Impression / Assessment and Plan / ED Course  I have reviewed the triage vital signs and the nursing notes.  Pertinent labs & imaging results that were available during my care of the patient were reviewed by me and considered in my medical decision making (see chart for details).      Final Clinical Impressions(s) / ED Diagnoses   Final diagnoses:  Pneumonia of left lower lobe due to infectious organism [facility])   53 year old male presenting to the emergency department today for evaluation of epigastric, left upper quadrant abdominal pain that he states is chronic but worsened today.  Pain radiates around to the left flank.  Pain is pleuritic in nature.  He has had associated cough for the last 2 weeks with black sputum.  He denies any shortness of breath or chest pain.  No associated GI or GU symptoms.  No fevers.  CBC shows a leukocytosis at 15.9.  He also has thrombocytosis at 723 which is worse from prior labs from 11 years ago. CMP is reassuring Lipase is  negative D-dimer is elevated at 1.23. Tylenol level obtained due to reports of frequent history of usage which is negative.  Chest x-ray shows large opacity in the left lower chest, neoplasm cannot be excluded but pneumonia is also in the differential.  CT with IV contrast recommended to exclude neoplastic process.  CTA of the chest: No demonstrable pulmonary embolus. No thoracic aortic aneurysm or dissection. 2. Airspace consolidation in the anterior segment of the left lower lobe extending into a portion of the lateral segment left lower lobe. This area of consolidation is suspected to represent pneumonia; an underlying mass lesion  cannot be excluded in this area. There is mild surrounding more patchy infiltrate in the left lower lobe. There is atelectasis in the right base posteriorly. Lungs elsewhere clear. 3. Subcarinal lymph node prominence which may be either of reactive etiology or potentially neoplastic. 4.  Small hiatal hernia. Comment: Given concern for potential underlying neoplasm in the area of consolidation in the left lower lobe, a follow-up study in 3-4 weeks after appropriate treatment for pneumonia is felt to be warranted. CT abdomen and pelvis: 1. No evident bowel obstruction. No abscess in the abdomen or pelvis. The appendix appears normal. 2. No evident renal or ureteral calculus. No hydronephrosis. Occasional prostatic calculi noted. Urinary bladder wall thickness normal. 3. Slight aortic and iliac artery atherosclerosis.   CT abdomen and pelvis obtained which showed no acute findings. No evident bowel obstruction. No abscess in the abdomen or pelvis. The appendix appears normal. No evident renal or ureteral calculus. No hydronephrosis. Occasional prostatic calculi noted. Urinary bladder wall thickness normal. Slight aortic and iliac artery atherosclerosis.   Reevaluated patient.  He needs to be satting well on room air.  I discussed the results of his work-up including the results  of the CT scan showing pneumonia with possible malignancy.  Will give Rx for antibiotics, albuterol and benzonatate for cough.  Patient does not have a PCP to follow-up for repeat imaging of the chest, discussed with case management who evaluated the patient at bedside and ensure they could follow-up with patient at [facility].  I voiced the importance of follow-up given the concern for possible malignancy.  I also discussed specific return precautions.  He voices understanding the plan and reasons to return.  All questions answered.  Patient stable for discharge.  ED Discharge Orders         Ordered    doxycycline (VIBRAMYCIN) 100 MG capsule  2 times daily     09/05/18 1125    benzonatate (TESSALON) 100 MG capsule  Every 8 hours     09/05/18 1125           Ashyra Cantin S, PA-C 09/05/18 1207    Virgina Norfolk, DO 09/05/18 1556

## 2018-09-05 NOTE — ED Notes (Signed)
Left for CT

## 2018-09-05 NOTE — ED Triage Notes (Signed)
PT reports he ABD did not hurt when he drank 3 40 oz beers yesterday . Pt reports he takes tylenol and uses MJ for sleep . PT has an elevated BP during triage . Pt  Observed hugging his ABD during triage assessment. Pt denies any N/V/D.

## 2018-09-05 NOTE — Discharge Instructions (Addendum)
You were given a prescription for antibiotics. Please take the antibiotic prescription fully.  Please take 2 puffs of the albuterol inhaler every 4-6 hours as needed for cough or redness of breath.  You were also given a prescription for cough medication.  Please take as directed.   You were noted to have a possible pneumonia in the left lower lobe of your lungs on the CT scan.  It is possible that this pneumonia actually represents a mass.  You will need to have a repeat chest x-ray or CT scan of your chest in the next 3 to 4 weeks to ensure resolution of the pneumonia and help determine whether this is a malignancy.  You were given information to follow-up with a primary care doctor in order to do this.  Please call the office to try to set up an appointment for follow-up.  These return to the emergency department for any chest pain, shortness of breath, fevers, or any new or worsening symptoms in the meantime.

## 2019-04-06 ENCOUNTER — Other Ambulatory Visit: Payer: Self-pay

## 2019-04-06 ENCOUNTER — Emergency Department (HOSPITAL_COMMUNITY): Payer: Self-pay

## 2019-04-06 ENCOUNTER — Encounter (HOSPITAL_COMMUNITY): Payer: Self-pay

## 2019-04-06 ENCOUNTER — Emergency Department (HOSPITAL_COMMUNITY)
Admission: EM | Admit: 2019-04-06 | Discharge: 2019-04-06 | Disposition: A | Discharge status: Home / Self Care | Payer: Self-pay | Attending: Emergency Medicine | Admitting: Emergency Medicine

## 2019-04-06 DIAGNOSIS — Z79899 Other long term (current) drug therapy: Secondary | ICD-10-CM | POA: Insufficient documentation

## 2019-04-06 DIAGNOSIS — R0789 Other chest pain: Secondary | ICD-10-CM | POA: Insufficient documentation

## 2019-04-06 DIAGNOSIS — F17210 Nicotine dependence, cigarettes, uncomplicated: Secondary | ICD-10-CM | POA: Insufficient documentation

## 2019-04-06 LAB — CBC
HCT: 43.2 % (ref 39.0–52.0)
Hemoglobin: 14.3 g/dL (ref 13.0–17.0)
MCH: 32.1 pg (ref 26.0–34.0)
MCHC: 33.1 g/dL (ref 30.0–36.0)
MCV: 96.9 fL (ref 80.0–100.0)
Platelets: 405 10*3/uL — ABNORMAL HIGH (ref 150–400)
RBC: 4.46 MIL/uL (ref 4.22–5.81)
RDW: 13.4 % (ref 11.5–15.5)
WBC: 7.7 10*3/uL (ref 4.0–10.5)
nRBC: 0 % (ref 0.0–0.2)

## 2019-04-06 LAB — BASIC METABOLIC PANEL
Anion gap: 9 (ref 5–15)
BUN: 16 mg/dL (ref 6–20)
CO2: 25 mmol/L (ref 22–32)
Calcium: 9.3 mg/dL (ref 8.9–10.3)
Chloride: 103 mmol/L (ref 98–111)
Creatinine, Ser: 1.11 mg/dL (ref 0.61–1.24)
GFR calc Af Amer: >60 mL/min (ref >60)
GFR calc non Af Amer: >60 mL/min (ref >60)
Glucose, Bld: 111 mg/dL — ABNORMAL HIGH (ref 70–99)
Potassium: 3.9 mmol/L (ref 3.5–5.1)
Sodium: 137 mmol/L (ref 135–145)

## 2019-04-06 LAB — TROPONIN I (HIGH SENSITIVITY)
Troponin I (High Sensitivity): 5 ng/L (ref <18)
Troponin I (High Sensitivity): 4 ng/L (ref <18)

## 2019-04-06 MED ORDER — ALUM & MAG HYDROXIDE-SIMETH 200-200-20 MG/5ML PO SUSP
30.0000 mL | Freq: Once | ORAL | Status: AC
  Administered 2019-04-06: 30 mL via ORAL
  Filled 2019-04-06: qty 30

## 2019-04-06 MED ORDER — ESOMEPRAZOLE MAGNESIUM 40 MG PO CPDR: 40.0000 mg | DELAYED_RELEASE_CAPSULE | Freq: Every day | ORAL | 0 refills | Status: AC

## 2019-04-06 MED ORDER — SODIUM CHLORIDE 0.9% FLUSH: 3.0000 mL | Freq: Once | INTRAVENOUS | Status: DC

## 2019-04-06 NOTE — ED Triage Notes (Signed)
Onset 7 months intermittant chest pain.  Pt was seen in June for same.  Pain is everyday, lasts 3 min to 1 hour, occurs throughout today.  Sweating, nausea, shortness of breath symptoms occur with chest pain.

## 2019-04-06 NOTE — ED Provider Notes (Addendum)
Repeat trop, if normal, anticipate D/C. Physical Exam  BP 122/90   Pulse 88   Temp 98.4 F (36.9 C) (Oral)   Resp 17   Ht 5\' 7"  (1.702 m)   Wt 74.8 kg   SpO2 99%   BMI 25.84 kg/m   Physical Exam  ED Course/Procedures     Procedures  MDM  Patient left before getting review of his results by me and printed discharge instructions including return precautions and information for managing symptoms.       , MD 04/06/19 06/04/19    [id number], MD 04/06/19 1718

## 2019-04-06 NOTE — ED Notes (Signed)
  Pt left prior to discharge paperwork being provided

## 2019-04-06 NOTE — ED Provider Notes (Signed)
[facility] EMERGENCY DEPARTMENT Provider Note   CSN: [id number] Arrival date & time: 04/06/19  1154     History No chief complaint on file.   Dennis Rojas is a 54 y.o. male.  54 yo M with a chief complaints of chest pain.  This been off and on for about 6 or 7 months now.  Pain seems to come and go scattered throughout the chest the most commonly occurs on the left.  Last for a few seconds at a time and then resolves.  Not exertional has some shortness of breath may be off and on with it.  Seems to be worse when he drinks alcohol.  Seems to be worse with smoking.  Denies cough denies fever.  Denies trauma to the chest.  Was seen here about 6 months ago at the onset.  Had a CT scan that showed a spot on his lung which he is concerned about.  Denies history of MI.  Denies history of hypertension hyperlipidemia diabetes.  He is adopted he is unknown family history.  Current smoker off and on.  Denies history of PE or DVT denies hemoptysis denies unilateral lower extremity edema denies recent surgery immobilization travel.  The history is provided by the patient.  Illness Severity:  Moderate Onset quality:  Gradual Duration:  6 months Timing:  Intermittent Progression:  Waxing and waning Chronicity:  New Associated symptoms: chest pain and shortness of breath   Associated symptoms: no abdominal pain, no congestion, no diarrhea, no fever, no headaches, no myalgias, no rash and no vomiting        History reviewed. No pertinent past medical history.  There are no problems to display for this patient.   History reviewed. No pertinent surgical history.     History reviewed. No pertinent family history.  Social History   Tobacco Use  . Smoking status: Current Some Day Smoker  . Smokeless tobacco: Never Used  Substance Use Topics  . Alcohol use: No    Comment: Daily  3 40"s on SAT 09-04-18  . Drug use: No    Home Medications Prior to Admission medications     Medication Sig Start Date End Date Taking? Authorizing Provider  diphenhydramine-acetaminophen (TYLENOL PM) 25-500 MG TABS tablet Take 3.5 tablets by mouth at bedtime as needed (pain/sleep).    [provider]  oxyCODONE-acetaminophen (PERCOCET/ROXICET) 5-325 MG per tablet Take 2 tablets by mouth every 4 (four) hours as needed for pain. Patient not taking: Reported on 09/05/2018 11/19/12   Lorre Nick, MD    Allergies    Patient has no known allergies.  Review of Systems   Review of Systems  Constitutional: Negative for chills and fever.  HENT: Negative for congestion and facial swelling.   Eyes: Negative for discharge and visual disturbance.  Respiratory: Positive for shortness of breath.   Cardiovascular: Positive for chest pain. Negative for palpitations.  Gastrointestinal: Negative for abdominal pain, diarrhea and vomiting.  Musculoskeletal: Negative for arthralgias and myalgias.  Skin: Negative for color change and rash.  Neurological: Negative for tremors, syncope and headaches.  Psychiatric/Behavioral: Negative for confusion and dysphoric mood.    Physical Exam Updated Vital Signs BP (!) 161/106   Pulse 68   Temp 98.4 F (36.9 C) (Oral)   Resp 19   Ht 5\' 7"  (1.702 m)   Wt 74.8 kg   SpO2 100%   BMI 25.84 kg/m   Physical Exam Vitals and nursing note reviewed.  Constitutional:  Appearance: He is well-developed.  HENT:     Head: Normocephalic and atraumatic.  Eyes:     Pupils: Pupils are equal, round, and reactive to light.  Neck:     Vascular: No JVD.  Cardiovascular:     Rate and Rhythm: Normal rate and regular rhythm.     Heart sounds: No murmur. No friction rub. No gallop.   Pulmonary:     Effort: No respiratory distress.     Breath sounds: No wheezing.  Abdominal:     General: There is no distension.     Tenderness: There is abdominal tenderness. There is no guarding or rebound.     Comments: Mild epigastric tenderness  Musculoskeletal:         General: Normal range of motion.     Cervical back: Normal range of motion and neck supple.  Skin:    Coloration: Skin is not pale.     Findings: No rash.  Neurological:     Mental Status: He is alert and oriented to person, place, and time.  Psychiatric:        Behavior: Behavior normal.     ED Results / Procedures / Treatments   Labs (all labs ordered are listed, but only abnormal results are displayed) Labs Reviewed  BASIC METABOLIC PANEL - Abnormal; Notable for the following components:      Result Value   Glucose, Bld 111 (*)    All other components within normal limits  CBC - Abnormal; Notable for the following components:   Platelets 405 (*)    All other components within normal limits  TROPONIN I (HIGH SENSITIVITY)  TROPONIN I (HIGH SENSITIVITY)    EKG EKG Interpretation  Date/Time:  Wednesday April 06 2019 12:05:57 EST Ventricular Rate:  85 PR Interval:  130 QRS Duration: 92 QT Interval:  368 QTC Calculation: 437 R Axis:   67 Text Interpretation: Normal sinus rhythm Right atrial enlargement Pulmonary disease pattern Abnormal ECG No significant change since last tracing Confirmed by Deno Etienne [phone]) on 04/06/2019 2:27:27 PM   Radiology DG Chest 2 View  Result Date: 04/06/2019 CLINICAL DATA:  Onset 7 months intermittant chest pain. Pt was seen in June for same. Pain is everyday, lasts 3 min to 1 hour, occurs throughout today. Sweating, nausea, shortness of breath symptoms occur with chest pain. chest pain EXAM: CHEST - 2 VIEW COMPARISON:  None. FINDINGS: Normal mediastinum and cardiac silhouette. Normal pulmonary vasculature. No evidence of effusion, infiltrate, or pneumothorax. No acute bony abnormality. IMPRESSION: Normal chest radiograph. Electronically Signed   By: Suzy Bouchard M.D.   On: 04/06/2019 12:44    Procedures Procedures (including critical care time)  Medications Ordered in ED Medications  sodium chloride flush (NS) 0.9 % injection 3  mL (has no administration in time range)  alum & mag hydroxide-simeth (MAALOX/MYLANTA) 200-200-20 MG/5ML suspension 30 mL (has no administration in time range)    ED Course  I have reviewed the triage vital signs and the nursing notes.  Pertinent labs & imaging results that were available during my care of the patient were reviewed by me and considered in my medical decision making (see chart for details).    MDM Rules/Calculators/A&P                      53 yo M with chief complaint of chest pain.  This is atypical in nature last only for seconds and seems to come and go.  Most likely this seems  to be reflux as it is worse with alcohol and certain foods.  His initial troponin is negative EKG without concerning finding.  Chest x-ray viewed by me without focal infiltrate or pneumothorax.  Will obtain a second troponin.  Give a GI cocktail.  Awaiting second troponin.  Signed out to Dr. Clarice Pole, please see her note for further details care in the ED.  The patients results and plan were reviewed and discussed.   Any x-rays performed were independently reviewed by myself.   Differential diagnosis were considered with the presenting HPI.  Medications  sodium chloride flush (NS) 0.9 % injection 3 mL (has no administration in time range)  alum & mag hydroxide-simeth (MAALOX/MYLANTA) 200-200-20 MG/5ML suspension 30 mL (30 mLs Oral Given 04/06/19 1519)    Vitals:   04/06/19 1353 04/06/19 1439 04/06/19 1440 04/06/19 1515  BP: (!) 164/104 (!) 161/106  122/90  Pulse: 73  68 88  Resp: 16 18 19 17   Temp:      TempSrc:      SpO2: 100%  100% 99%  Weight:      Height:        Final diagnoses:  Atypical chest pain     Final Clinical Impression(s) / ED Diagnoses Final diagnoses:  None    Rx / DC Orders ED Discharge Orders    None       , DO 04/06/19 1532

## 2019-04-06 NOTE — Discharge Instructions (Addendum)
Try zantac or pepcid twice a day.  Try to avoid things that may make this worse, most commonly these are spicy foods tomato based products fatty foods chocolate and peppermint.  Alcohol and tobacco can also make this worse.  Return to the emergency department for sudden worsening pain fever or inability to eat or drink.  See your doctor for recheck as soon as possible.  Return to the emergency department immediately for worsening new or concerning symptoms.

## 2021-06-30 IMAGING — CR DG CHEST 2V
2 series · 2 of 2 positions shown · non-contrast
Comparison: None.

CLINICAL DATA: Onset 7 months intermittant chest pain. Pt was seen
in [REDACTED] for same. Pain is everyday, lasts 3 min to 1 hour, occurs
throughout today. Sweating, nausea, shortness of breath symptoms
occur with chest pain. chest pain

EXAM:
CHEST - 2 VIEW

[chest pa]
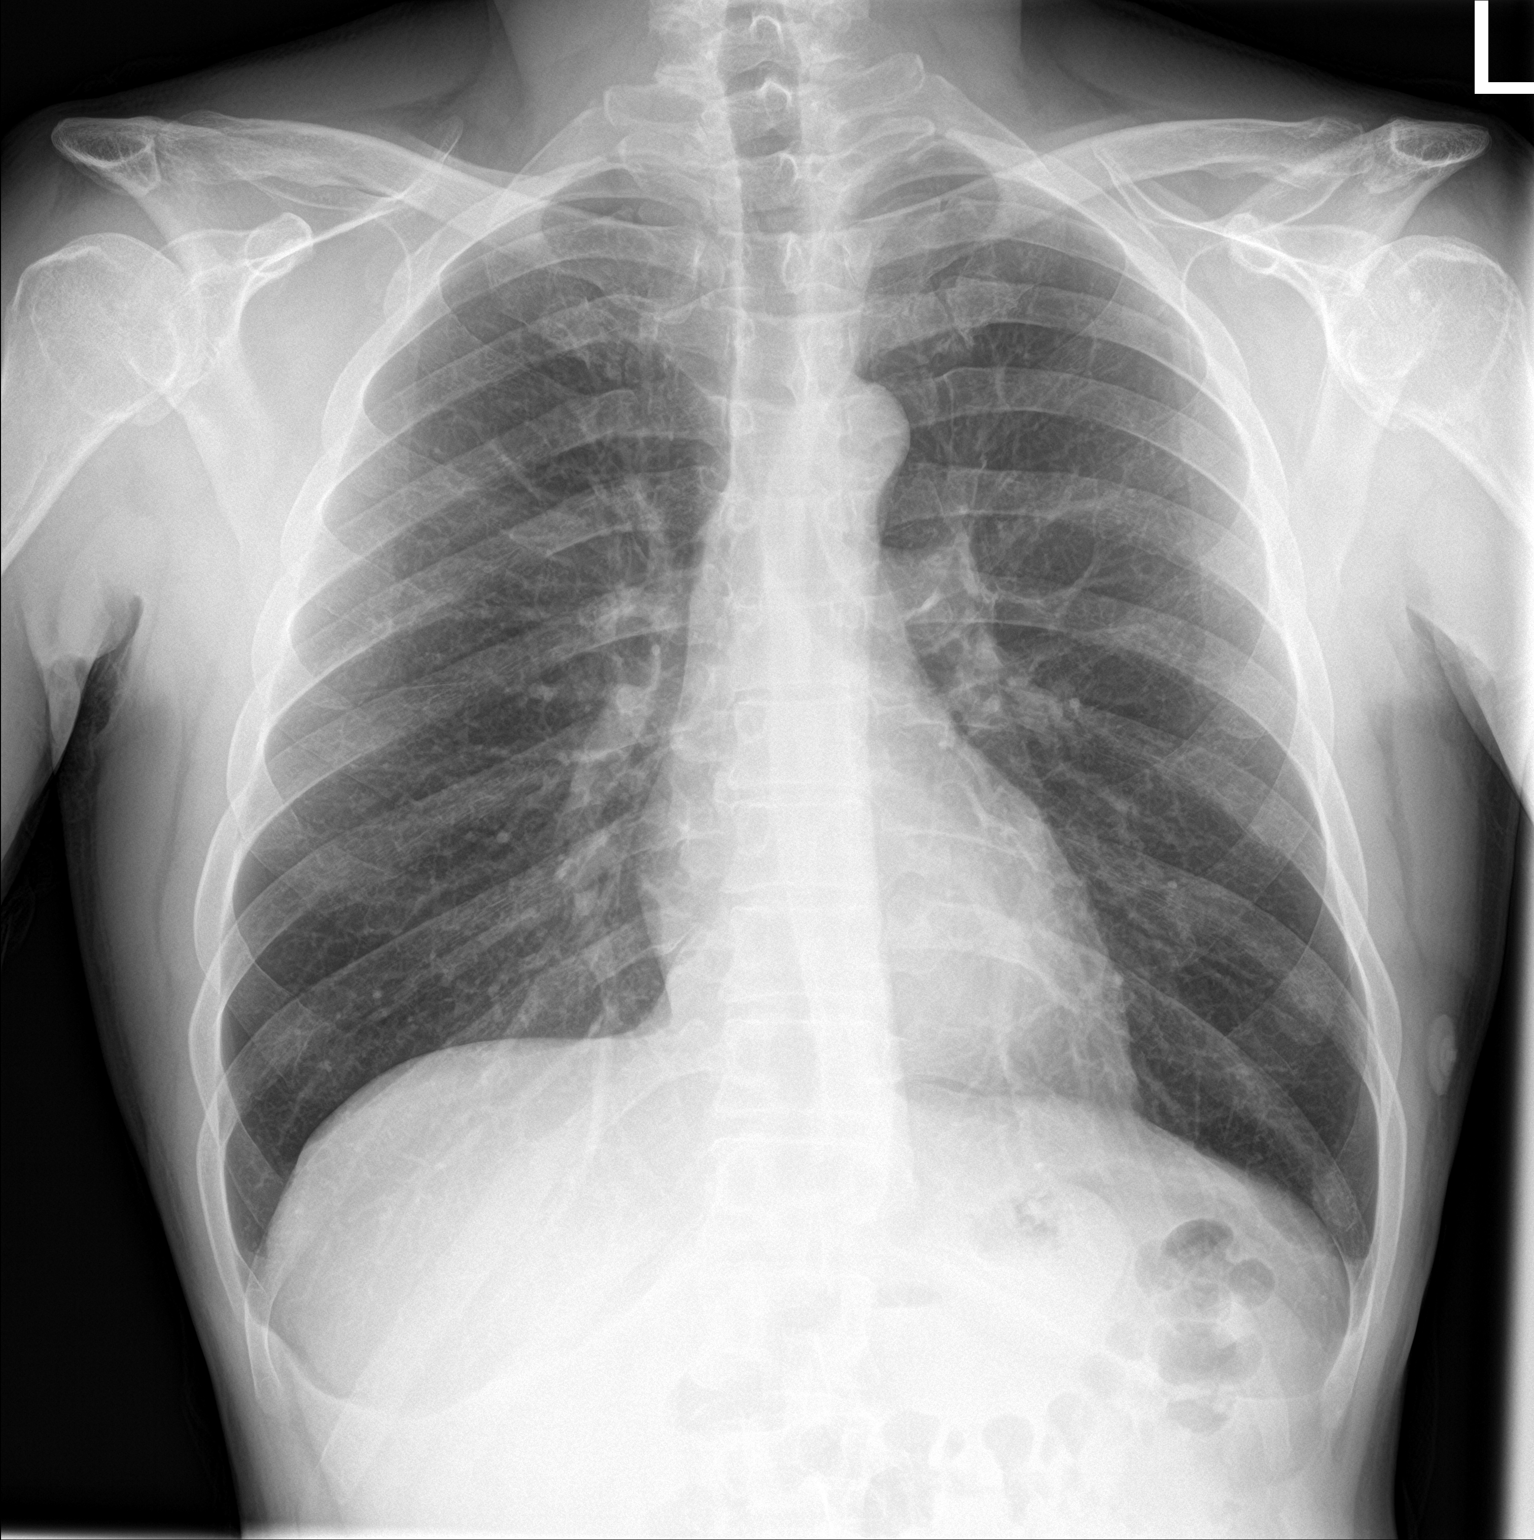

[chest lat]
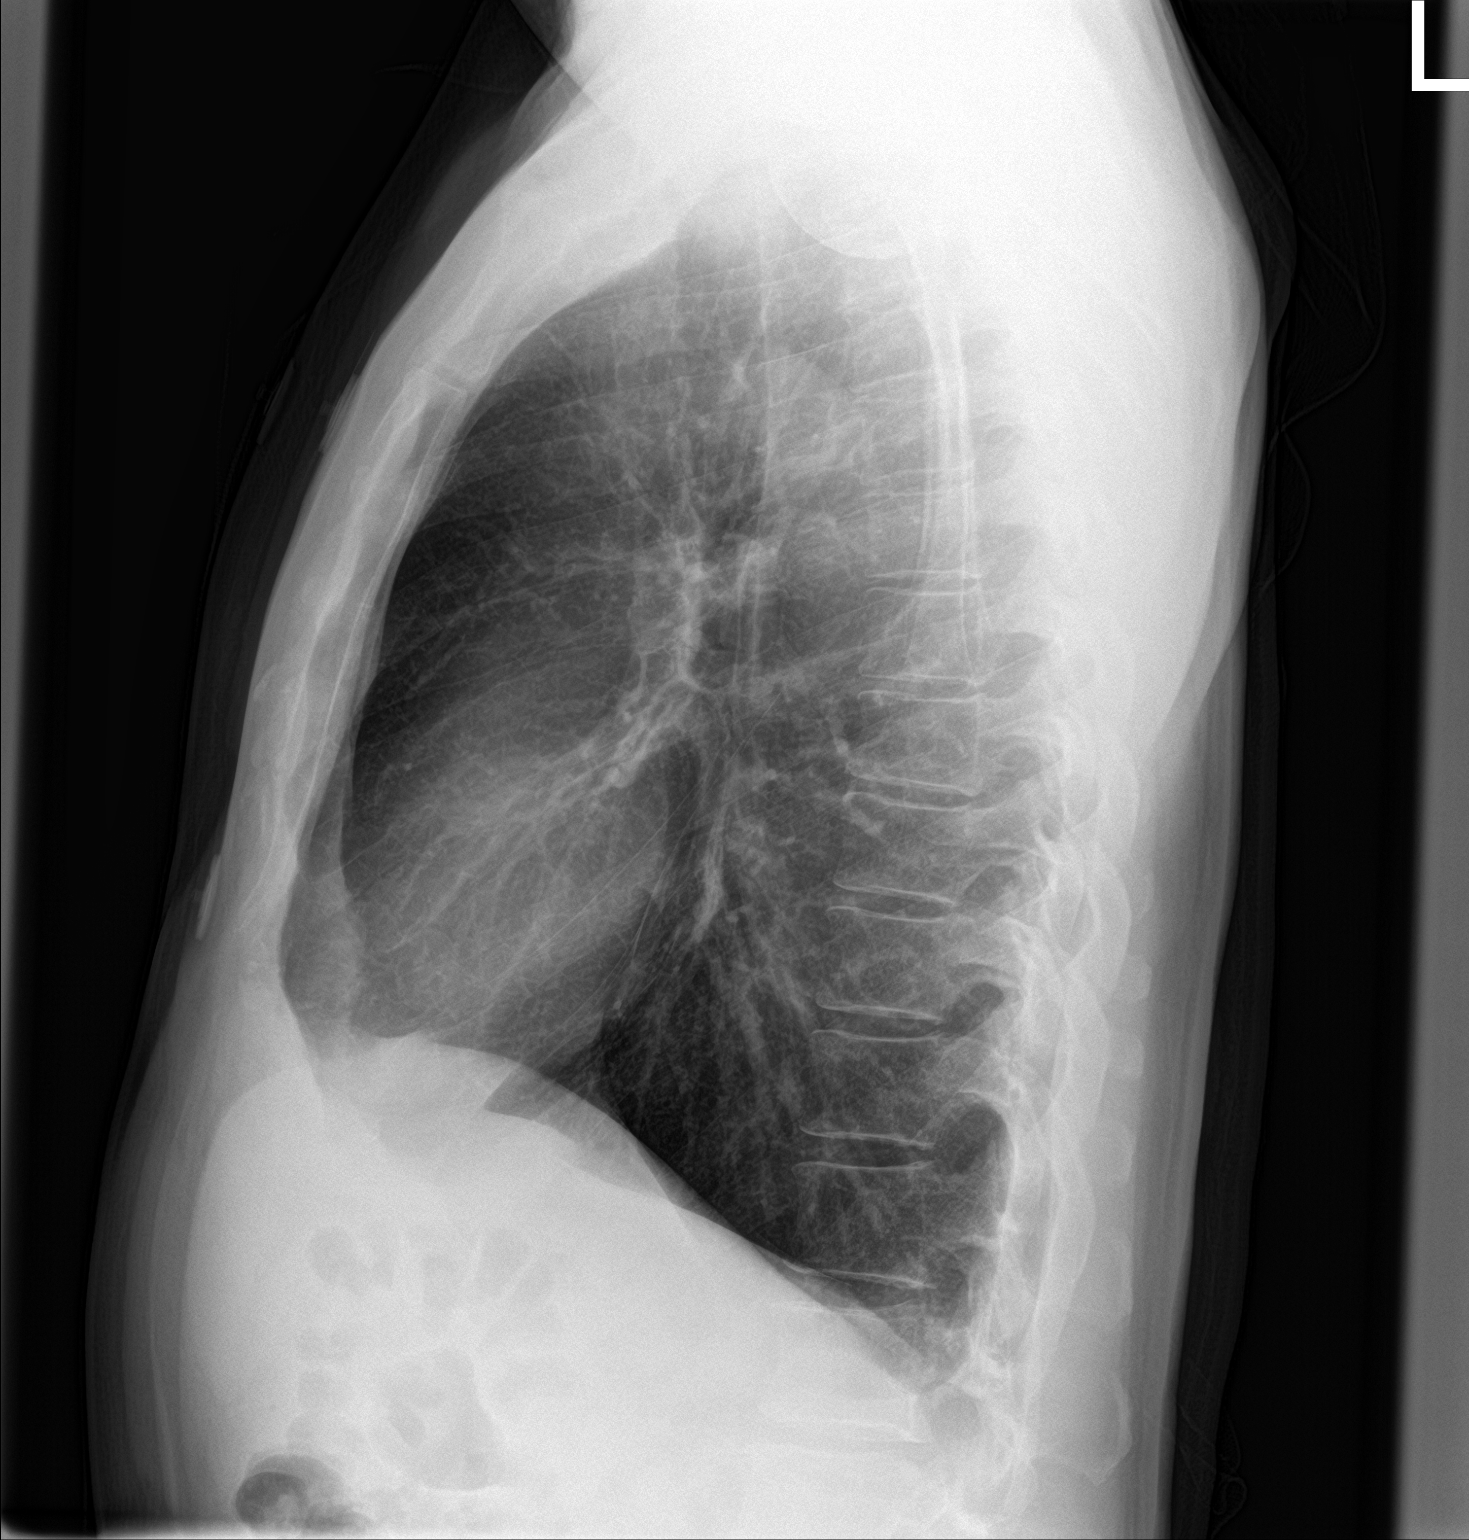

[2 of 2 positions shown; findings below may reference images not displayed]

FINDINGS: Normal mediastinum and cardiac silhouette. Normal pulmonary
vasculature. No evidence of effusion, infiltrate, or pneumothorax.
No acute bony abnormality.
IMPRESSION: Normal chest radiograph.

## 2023-09-23 ENCOUNTER — Other Ambulatory Visit: Payer: Self-pay

## 2023-09-23 ENCOUNTER — Emergency Department (HOSPITAL_COMMUNITY)
Admission: EM | Admit: 2023-09-23 | Discharge: 2023-09-23 | Discharge status: Against Medical Advice | Payer: Self-pay | Attending: Emergency Medicine | Admitting: Emergency Medicine

## 2023-09-23 DIAGNOSIS — Z5321 Procedure and treatment not carried out due to patient leaving prior to being seen by health care provider: Secondary | ICD-10-CM | POA: Insufficient documentation

## 2023-09-23 DIAGNOSIS — H9203 Otalgia, bilateral: Secondary | ICD-10-CM | POA: Insufficient documentation

## 2023-09-23 DIAGNOSIS — H9193 Unspecified hearing loss, bilateral: Secondary | ICD-10-CM | POA: Insufficient documentation

## 2023-09-23 NOTE — ED Triage Notes (Signed)
 Pt with hearing loss to both ears. Pt has been using ear plugs for many years. Pt has noticed increased hearing loss for one month. Pt using peroxide to clean ears. Pt is able to hear after removing wax for about 3 days and now he can't hear again.

## 2023-09-23 NOTE — ED Provider Triage Note (Signed)
 Emergency Medicine Provider Triage Evaluation Note  Dennis Rojas , a 59 y.o. male  was evaluated in triage.  Pt complains of bilateral hearing loss, reports this has been ongoing for a week.  Has been applying peroxide to both ears, taking a screwdriver and cleaning this out.  Now reports he hears muffled.  Review of Systems  Positive: Bilateral ear pain, hearing loss Negative: Dizziness, headache  Physical Exam  BP (!) 166/107 (BP Location: Right Arm)   Pulse 97   Temp 98.8 F (37.1 C)   Resp 18   Ht 5' 7 (1.702 m)   Wt 74.8 kg   SpO2 95%   BMI 25.84 kg/m  Gen:   Awake, no distress   Resp:  Normal effort  MSK:   Moves extremities without difficulty  Other:  Significant cerumen impaction to the right ear, left ear does have residual cerumen, unable to visualize the tympanic membrane bilaterally.  Medical Decision Making  Medically screening exam initiated at 3:07 PM.  Appropriate orders placed.  Dennis Rojas was informed that the remainder of the evaluation will be completed by another provider, this initial triage assessment does not replace that evaluation, and the importance of remaining in the ED until their evaluation is complete.     Dennis Bitter, PA-C 09/23/23 1510

## 2023-09-23 NOTE — ED Notes (Signed)
 Patient decided to leave.

## 2023-09-30 ENCOUNTER — Ambulatory Visit: Payer: Self-pay

## 2023-10-19 ENCOUNTER — Ambulatory Visit: Payer: Self-pay

## 2023-10-19 NOTE — Telephone Encounter (Signed)
 FYI Only or Action Required?: FYI only for provider.  Patient calling to make a new patient appointment. Appointment set up for 10/29/2023. Patient given information for Mobile Bus for tomorrow.  Called Nurse Triage reporting Otalgia.  Symptoms began several weeks ago.  Interventions attempted: Nothing.  Symptoms are: unchanged.  Triage Disposition: See Physician Within 24 Hours-Mobile Bus  Patient/caregiver understands and will follow disposition?: Yes  Copied from CRM [id number]. Topic: Clinical - Red Word Triage >> Oct 19, 2023  4:31 PM Delon HERO wrote: Red Word that prompted transfer to Nurse Triage: Patient is calling to report that his left ear is impacted with pain and blood in the ear. Went to the ED 09/23/23. Patient reporting no insurance. Reason for Disposition  Bloody discharge or unexplained bleeding from ear canal  Answer Assessment - Initial Assessment Questions 1. LOCATION: Which ear is involved?     Left ear 2. ONSET: When did the ear pain start?      Started within the last couple of weeks 3. SEVERITY: How bad is the pain?  (Scale 1-10; mild, moderate or severe)     Mild-moderate 4. URI SYMPTOMS: Do you have a runny nose or cough?     no 5. FEVER: Do you have a fever? If Yes, ask: What is your temperature, how was it measured, and when did it start?     no 6. CAUSE: Have you been swimming recently?, How often do you use Q-TIPS?, Have you had any recent air travel or scuba diving?     no 7. OTHER SYMPTOMS: Do you have any other symptoms? (e.g., decreased hearing, dizziness, headache, stiff neck, vomiting)     Decreased hearing, ringing in ears  Patient reports symptoms have been ongoing with patient having a trip to the ED. Patient states nothing was done to alleviate ear symptoms. Patient is scheduled for new patient appointment with [facility] for 7/31. Patient is instructed to follow up tomorrow with the Mobile Bus. Patient given the  address of the location of the bus for tomorrow. Patient verbalized understanding and all questions answered.  Protocols used: Rilla

## 2023-10-29 ENCOUNTER — Ambulatory Visit: Payer: Self-pay | Admitting: Family Medicine

## 2023-12-16 ENCOUNTER — Ambulatory Visit: Payer: Self-pay | Admitting: Family Medicine
